# Patient Record
Sex: Male | Born: 1965 | Race: White | Hispanic: No | Marital: Single | Smoking: Current every day smoker
Health system: Southern US, Community
[De-identification: ages and names within clinical notes are randomized; demographics above are authoritative.]

## PROBLEM LIST (undated history)

## (undated) DIAGNOSIS — J449 Chronic obstructive pulmonary disease, unspecified: Secondary | ICD-10-CM

## (undated) DIAGNOSIS — F32A Depression, unspecified: Secondary | ICD-10-CM

## (undated) DIAGNOSIS — F419 Anxiety disorder, unspecified: Secondary | ICD-10-CM

## (undated) HISTORY — PX: OTHER SURGICAL HISTORY: SHX169

## (undated) HISTORY — DX: Depression, unspecified: F32.A

## (undated) HISTORY — DX: Anxiety disorder, unspecified: F41.9

---

## 2003-11-10 ENCOUNTER — Emergency Department (HOSPITAL_COMMUNITY): Admission: EM | Admit: 2003-11-10 | Discharge: 2003-11-10 | Payer: Self-pay | Admitting: *Deleted

## 2009-01-03 ENCOUNTER — Emergency Department (HOSPITAL_COMMUNITY): Admission: EM | Admit: 2009-01-03 | Discharge: 2009-01-04 | Payer: Self-pay | Admitting: Emergency Medicine

## 2009-01-05 ENCOUNTER — Emergency Department (HOSPITAL_COMMUNITY): Admission: EM | Admit: 2009-01-05 | Discharge: 2009-01-05 | Payer: Self-pay | Admitting: Emergency Medicine

## 2010-10-07 LAB — COMPREHENSIVE METABOLIC PANEL
ALT: 31 U/L (ref 0–53)
Albumin: 3.9 g/dL (ref 3.5–5.2)
Alkaline Phosphatase: 57 U/L (ref 39–117)
Calcium: 9 mg/dL (ref 8.4–10.5)
GFR calc Af Amer: >60 mL/min (ref >60)
Potassium: 3.5 mEq/L (ref 3.5–5.1)
Sodium: 143 mEq/L (ref 135–145)
Total Protein: 6.7 g/dL (ref 6.0–8.3)

## 2010-10-07 LAB — HEPATIC FUNCTION PANEL
ALT: 26 U/L (ref 0–53)
AST: 28 U/L (ref 0–37)
Albumin: 3.6 g/dL (ref 3.5–5.2)
Bilirubin, Direct: 0.1 mg/dL (ref 0.0–0.3)
Total Protein: 6.6 g/dL (ref 6.0–8.3)

## 2010-10-07 LAB — POCT CARDIAC MARKERS
CKMB, poc: <1 ng/mL — ABNORMAL LOW (ref 1.0–8.0)
Myoglobin, poc: 27.3 ng/mL (ref 12–200)
Myoglobin, poc: 38.1 ng/mL (ref 12–200)

## 2010-10-07 LAB — URINALYSIS, ROUTINE W REFLEX MICROSCOPIC
Glucose, UA: NEGATIVE mg/dL
Hgb urine dipstick: NEGATIVE
Ketones, ur: NEGATIVE mg/dL
Protein, ur: NEGATIVE mg/dL
pH: 5.5 (ref 5.0–8.0)

## 2010-10-07 LAB — DIFFERENTIAL
Basophils Absolute: 0 10*3/uL (ref 0.0–0.1)
Basophils Relative: 1 % (ref 0–1)
Basophils Relative: 2 % — ABNORMAL HIGH (ref 0–1)
Eosinophils Absolute: 0.1 10*3/uL (ref 0.0–0.7)
Lymphocytes Relative: 27 % (ref 12–46)
Lymphs Abs: 1.6 10*3/uL (ref 0.7–4.0)
Monocytes Absolute: 0.4 10*3/uL (ref 0.1–1.0)
Monocytes Absolute: 0.5 10*3/uL (ref 0.1–1.0)
Monocytes Relative: 8 % (ref 3–12)
Neutro Abs: 3.9 10*3/uL (ref 1.7–7.7)
Neutrophils Relative %: 63 % (ref 43–77)

## 2010-10-07 LAB — CBC
Hemoglobin: 15 g/dL (ref 13.0–17.0)
MCHC: 36.2 g/dL — ABNORMAL HIGH (ref 30.0–36.0)
Platelets: 200 10*3/uL (ref 150–400)
Platelets: 204 10*3/uL (ref 150–400)
RBC: 4.21 MIL/uL — ABNORMAL LOW (ref 4.22–5.81)
RDW: 12.4 % (ref 11.5–15.5)
RDW: 12.4 % (ref 11.5–15.5)
WBC: 5.6 10*3/uL (ref 4.0–10.5)

## 2010-10-07 LAB — URINE CULTURE
Colony Count: NO GROWTH
Culture: NO GROWTH

## 2010-10-07 LAB — BASIC METABOLIC PANEL
BUN: 5 mg/dL — ABNORMAL LOW (ref 6–23)
CO2: 23 mEq/L (ref 19–32)
Calcium: 9 mg/dL (ref 8.4–10.5)
Creatinine, Ser: 0.74 mg/dL (ref 0.4–1.5)
GFR calc non Af Amer: >60 mL/min (ref >60)
Glucose, Bld: 100 mg/dL — ABNORMAL HIGH (ref 70–99)
Sodium: 132 mEq/L — ABNORMAL LOW (ref 135–145)

## 2012-04-01 ENCOUNTER — Emergency Department (HOSPITAL_COMMUNITY)
Admission: EM | Admit: 2012-04-01 | Discharge: 2012-04-01 | Disposition: A | Discharge status: Home / Self Care | Payer: Worker's Compensation | Attending: Emergency Medicine | Admitting: Emergency Medicine

## 2012-04-01 ENCOUNTER — Encounter (HOSPITAL_COMMUNITY): Payer: Self-pay | Admitting: Emergency Medicine

## 2012-04-01 ENCOUNTER — Emergency Department (HOSPITAL_COMMUNITY): Payer: Worker's Compensation

## 2012-04-01 DIAGNOSIS — M79609 Pain in unspecified limb: Secondary | ICD-10-CM | POA: Insufficient documentation

## 2012-04-01 DIAGNOSIS — S8990XA Unspecified injury of unspecified lower leg, initial encounter: Secondary | ICD-10-CM | POA: Insufficient documentation

## 2012-04-01 DIAGNOSIS — M79676 Pain in unspecified toe(s): Secondary | ICD-10-CM

## 2012-04-01 MED ORDER — HYDROCODONE-ACETAMINOPHEN 5-325 MG PO TABS: 1.0000 | ORAL_TABLET | Freq: Four times a day (QID) | ORAL | Status: AC | PRN

## 2012-04-01 MED ORDER — HYDROCODONE-ACETAMINOPHEN 5-325 MG PO TABS
1.0000 | ORAL_TABLET | Freq: Once | ORAL | Status: AC
  Administered 2012-04-01: 1 via ORAL
  Filled 2012-04-01: qty 1

## 2012-04-01 MED ORDER — IBUPROFEN 400 MG PO TABS
600.0000 mg | ORAL_TABLET | Freq: Once | ORAL | Status: AC
  Administered 2012-04-01: 600 mg via ORAL
  Filled 2012-04-01: qty 2

## 2012-04-01 NOTE — ED Notes (Signed)
Patient states he fell and injured his left foot 2 months ago. Patient states he has had swelling and pain since then but it has worsened and he "can't hardly walk."

## 2012-04-01 NOTE — ED Provider Notes (Signed)
History     CSN: [id number]  Arrival date & time 04/01/12  2048   None     Chief Complaint  Patient presents with  . Foot Injury    (Consider location/radiation/quality/duration/timing/severity/associated sxs/prior treatment) HPI Comments: Pt was pulling on a pipe using it a lever.  He slipped and fell hyper dorsiflexing L foot. Has had  Pain and swelling in 1st MTP since.    Patient is a 46 y.o. male presenting with foot injury. The history is provided by the patient.  Foot Injury  Incident onset: ~ 2 months ago. The incident occurred at work. The pain is present in the left foot. The pain is moderate. The pain has been constant since onset. Associated symptoms include inability to bear weight and loss of motion. He reports no foreign bodies present. The symptoms are aggravated by bearing weight. He has tried nothing for the symptoms.    History reviewed. No pertinent past medical history.  Past Surgical History  Procedure Date  . Arm surgery     History reviewed. No pertinent family history.  History  Substance Use Topics  . Smoking status: Current Every Day Smoker  . Smokeless tobacco: Not on file  . Alcohol Use: Yes     daily      Review of Systems  Musculoskeletal:       Foot injury  Skin: Negative for wound.  All other systems reviewed and are negative.    Allergies  Review of patient's allergies indicates no known allergies.  Home Medications  No current outpatient prescriptions on file.  BP 115/73  Pulse 73  Temp 97.4 F (36.3 C) (Oral)  Resp 20  Ht 5\' 2"  (1.575 m)  Wt 125 lb (56.7 kg)  BMI 22.86 kg/m2  SpO2 100%  Physical Exam  Nursing note and vitals reviewed. Constitutional: He is oriented to person, place, and time. He appears well-developed and well-nourished.  HENT:  Head: Normocephalic and atraumatic.  Eyes: EOM are normal.  Neck: Normal range of motion.  Cardiovascular: Normal rate, regular rhythm, normal heart sounds and intact  distal pulses.   Pulmonary/Chest: Effort normal and breath sounds normal. No respiratory distress.  Abdominal: Soft. He exhibits no distension. There is no tenderness.  Musculoskeletal: He exhibits tenderness.       Left foot: He exhibits decreased range of motion, tenderness, bony tenderness and swelling. He exhibits normal capillary refill, no crepitus, no deformity and no laceration.       Feet:  Neurological: He is alert and oriented to person, place, and time.  Skin: Skin is warm and dry.  Psychiatric: He has a normal mood and affect. Judgment normal.    ED Course  Procedures (including critical care time)  Labs Reviewed - No data to display Dg Foot Complete Left  04/01/2012  *RADIOLOGY REPORT*  Clinical Data: Left foot pain and swelling.  LEFT FOOT - COMPLETE 3+ VIEW  Comparison: None.  Findings: Three-view exam of the left foot shows no evidence for acute fracture involves the great toe.  There is degenerative change at the MTP joint of the great toe.  Deformity of the fourth toe proximal phalanx is compatible with age indeterminate trauma.  IMPRESSION: No acute findings in the great toe although there is degenerative change in the great toe MTP joint.  Deformity of the fourth toe proximal phalanx likely secondary to old trauma.   Original Report Authenticated By: ERIC A. MANSELL, M.D.      1. Great toe pain  MDM   no fxs Ice, elevation, post op shoe. Ibuprofen rx-hydrocodone, 20 F/u with dr. Romeo Apple.        Evalina Field, [place] 04/01/12 2229

## 2012-04-02 NOTE — ED Provider Notes (Signed)
Medical screening examination/treatment/procedure(s) were performed by non-physician practitioner and as supervising physician I was immediately available for consultation/collaboration.  Shelda Jakes, MD 04/02/12 [phone]

## 2014-01-06 ENCOUNTER — Encounter (HOSPITAL_COMMUNITY): Payer: Self-pay | Admitting: Emergency Medicine

## 2014-01-06 ENCOUNTER — Emergency Department (HOSPITAL_COMMUNITY)
Admission: EM | Admit: 2014-01-06 | Discharge: 2014-01-06 | Disposition: A | Discharge status: Home / Self Care | Payer: Self-pay | Attending: Emergency Medicine | Admitting: Emergency Medicine

## 2014-01-06 DIAGNOSIS — L259 Unspecified contact dermatitis, unspecified cause: Secondary | ICD-10-CM

## 2014-01-06 DIAGNOSIS — Z760 Encounter for issue of repeat prescription: Secondary | ICD-10-CM | POA: Insufficient documentation

## 2014-01-06 DIAGNOSIS — J449 Chronic obstructive pulmonary disease, unspecified: Secondary | ICD-10-CM | POA: Insufficient documentation

## 2014-01-06 DIAGNOSIS — L255 Unspecified contact dermatitis due to plants, except food: Secondary | ICD-10-CM | POA: Insufficient documentation

## 2014-01-06 DIAGNOSIS — Z79899 Other long term (current) drug therapy: Secondary | ICD-10-CM | POA: Insufficient documentation

## 2014-01-06 DIAGNOSIS — J4489 Other specified chronic obstructive pulmonary disease: Secondary | ICD-10-CM | POA: Insufficient documentation

## 2014-01-06 DIAGNOSIS — IMO0002 Reserved for concepts with insufficient information to code with codable children: Secondary | ICD-10-CM | POA: Insufficient documentation

## 2014-01-06 DIAGNOSIS — F17200 Nicotine dependence, unspecified, uncomplicated: Secondary | ICD-10-CM | POA: Insufficient documentation

## 2014-01-06 HISTORY — DX: Chronic obstructive pulmonary disease, unspecified: J44.9

## 2014-01-06 MED ORDER — PREDNISONE 10 MG PO TABS: ORAL_TABLET | ORAL | Status: DC

## 2014-01-06 MED ORDER — FAMOTIDINE 20 MG PO TABS
40.0000 mg | ORAL_TABLET | Freq: Once | ORAL | Status: AC
  Administered 2014-01-06: 40 mg via ORAL
  Filled 2014-01-06 (×2): qty 2

## 2014-01-06 MED ORDER — FAMOTIDINE 20 MG PO TABS: 20.0000 mg | ORAL_TABLET | Freq: Two times a day (BID) | ORAL | Status: DC

## 2014-01-06 MED ORDER — DIPHENHYDRAMINE HCL 25 MG PO CAPS
25.0000 mg | ORAL_CAPSULE | Freq: Once | ORAL | Status: AC
Start: 2014-01-06 — End: 2014-01-06
  Administered 2014-01-06: 25 mg via ORAL
  Filled 2014-01-06: qty 1

## 2014-01-06 MED ORDER — ALBUTEROL SULFATE HFA 108 (90 BASE) MCG/ACT IN AERS: 1.0000 | INHALATION_SPRAY | Freq: Four times a day (QID) | RESPIRATORY_TRACT | Status: DC | PRN

## 2014-01-06 MED ORDER — PREDNISONE 50 MG PO TABS
60.0000 mg | ORAL_TABLET | Freq: Once | ORAL | Status: AC
  Administered 2014-01-06: 60 mg via ORAL
  Filled 2014-01-06 (×2): qty 1

## 2014-01-06 NOTE — ED Notes (Signed)
Pt c/o generalized rash that burns and itches. Pt reports started 2 weeks ago and "comes and goes". Has tried OTC creams and lotions that has not helped.

## 2014-01-06 NOTE — ED Provider Notes (Signed)
CSN: [id number]     Arrival date & time 01/06/14  0849 History   First MD Initiated Contact with Patient 01/06/14 [phone]     Chief Complaint  Patient presents with  . Rash     (Consider location/radiation/quality/duration/timing/severity/associated sxs/prior Treatment) Patient is a 48 y.o. male presenting with rash. The history is provided by the patient and a relative.  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   Past Medical History  Diagnosis Date  . COPD (chronic obstructive pulmonary disease)    Past Surgical History  Procedure Laterality Date  . Arm surgery     History reviewed. No pertinent family history. History  Substance Use Topics  . Smoking status: Current Every Day Smoker  . Smokeless tobacco: Not on file  . Alcohol Use: Yes     Comment: daily    Review of Systems  Constitutional: Negative for fever.  HENT: Negative for sore throat.   Respiratory: Negative for shortness of breath and wheezing.   Gastrointestinal: Negative for nausea and  abdominal pain.  Skin: Positive for rash.      Allergies  Review of patient's allergies indicates no known allergies.  Home Medications   Prior to Admission medications   Medication Sig Start Date End Date Taking? Authorizing Provider  albuterol (PROVENTIL HFA;VENTOLIN HFA) 108 (90 BASE) MCG/ACT inhaler Inhale 1-2 puffs into the lungs every 6 (six) hours as needed for wheezing or shortness of breath.   Yes Historical Provider, MD  albuterol (PROVENTIL HFA;VENTOLIN HFA) 108 (90 BASE) MCG/ACT inhaler Inhale 1-2 puffs into the lungs every 6 (six) hours as needed for wheezing or shortness of breath. 01/06/14   Burgess Amor, PA-C  famotidine (PEPCID) 20 MG tablet Take 1 tablet (20 mg total) by mouth 2 (two) times daily. 01/06/14   Burgess Amor, PA-C  predniSONE (DELTASONE) 10 MG tablet 6, 5, 4, 3, 2 then 1 tablet by mouth daily for 6 days total. 01/06/14   Burgess Amor, PA-C   BP 134/87  Pulse 97  Temp(Src) 98 F (36.7 C) (Oral)  Ht 5\' 7"  (1.702 m)  Wt 120 lb (54.432 kg)  BMI 18.79 kg/m2  SpO2 100% Physical Exam  Constitutional: He appears well-developed and well-nourished. No distress.  HENT:  Head: Normocephalic.  Neck: Neck supple.  Cardiovascular: Normal rate.   Pulmonary/Chest: Effort normal. He has no wheezes. He has no rhonchi.  Musculoskeletal: Normal range of motion. He exhibits no edema.  Skin: Rash noted.  Patchy, excoriated macular dark lesions bilateral ankles and dorsal feet.  Excoriations on dorsal hands without obvious rash.  No blistering or drainage.  Lesions are dry with no central clearing.  Larges lesion 1 cm greatest diameter.  Few lesions upper back.      ED Course  Procedures (including critical care time) Labs Review Labs Reviewed - No data to display  Imaging Review No results found.   EKG Interpretation None      MDM   Final diagnoses:  Contact dermatitis  Medication refill   Prednisone, pepcid, benadryl.  Cool compresses.  Resource guide given for  finding pcp.  Also was given a refill prescription for his albuterol.  He [facility],  Did not qualify to continue going there,  So has tried to use his albuterol mdi sparingly.  No respiratory distress today.  Will prescribe refill.  Prn f/u anticipated.    Burgess AmorJulie Milicent Acheampong, PA-C 01/06/14 [phone]

## 2014-01-06 NOTE — Discharge Instructions (Signed)
Contact Dermatitis °Contact dermatitis is a reaction to certain substances that touch the skin. Contact dermatitis can be either irritant contact dermatitis or allergic contact dermatitis. Irritant contact dermatitis does not require previous exposure to the substance for a reaction to occur. Allergic contact dermatitis only occurs if you have been exposed to the substance before. Upon a repeat exposure, your body reacts to the substance.  °CAUSES  °Many substances can cause contact dermatitis. Irritant dermatitis is most commonly caused by repeated exposure to mildly irritating substances, such as: °· Makeup. °· Soaps. °· Detergents. °· Bleaches. °· Acids. °· Metal salts, such as nickel. °Allergic contact dermatitis is most commonly caused by exposure to: °· Poisonous plants. °· Chemicals (deodorants, shampoos). °· Jewelry. °· Latex. °· Neomycin in triple antibiotic cream. °· Preservatives in products, including clothing. °SYMPTOMS  °The area of skin that is exposed may develop: °· Dryness or flaking. °· Redness. °· Cracks. °· Itching. °· Pain or a burning sensation. °· Blisters. °With allergic contact dermatitis, there may also be swelling in areas such as the eyelids, mouth, or genitals.  °DIAGNOSIS  °Your caregiver can usually tell what the problem is by doing a physical exam. In cases where the cause is uncertain and an allergic contact dermatitis is suspected, a patch skin test may be performed to help determine the cause of your dermatitis. °TREATMENT °Treatment includes protecting the skin from further contact with the irritating substance by avoiding that substance if possible. Barrier creams, powders, and gloves may be helpful. Your caregiver may also recommend: °· Steroid creams or ointments applied 2 times daily. For best results, soak the rash area in cool water for 20 minutes. Then apply the medicine. Cover the area with a plastic wrap. You can store the steroid cream in the refrigerator for a "chilly"  effect on your rash. That may decrease itching. Oral steroid medicines may be needed in more severe cases. °· Antibiotics or antibacterial ointments if a skin infection is present. °· Antihistamine lotion or an antihistamine taken by mouth to ease itching. °· Lubricants to keep moisture in your skin. °· Burow's solution to reduce redness and soreness or to dry a weeping rash. Mix one packet or tablet of solution in 2 cups cool water. Dip a clean washcloth in the mixture, wring it out a bit, and put it on the affected area. Leave the cloth in place for 30 minutes. Do this as often as possible throughout the day. °· Taking several cornstarch or baking soda baths daily if the area is too large to cover with a washcloth. °Harsh chemicals, such as alkalis or acids, can cause skin damage that is like a burn. You should flush your skin for 15 to 20 minutes with cold water after such an exposure. You should also seek immediate medical care after exposure. Bandages (dressings), antibiotics, and pain medicine may be needed for severely irritated skin.  °HOME CARE INSTRUCTIONS °· Avoid the substance that caused your reaction. °· Keep the area of skin that is affected away from hot water, soap, sunlight, chemicals, acidic substances, or anything else that would irritate your skin. °· Do not scratch the rash. Scratching may cause the rash to become infected. °· You may take cool baths to help stop the itching. °· Only take over-the-counter or prescription medicines as directed by your caregiver. °· See your caregiver for follow-up care as directed to make sure your skin is healing properly. °SEEK MEDICAL CARE IF:  °· Your condition is not better after 3   days of treatment.  You seem to be getting worse.  You see signs of infection such as swelling, tenderness, redness, soreness, or warmth in the affected area.  You have any problems related to your medicines. Document Released: 06/14/2000 Document Revised: 09/09/2011  Document Reviewed: 11/20/2010 [facility] Patient Information 2015 [place], [place]. This information is not intended to replace advice given to you by your health care provider. Make sure you discuss any questions you have with your health care provider.   Emergency Department Resource Guide 1) Find a Doctor and Pay Out of Pocket Although you won't have to find out who is covered by your insurance plan, it is a good idea to ask around and get recommendations. You will then need to call the office and see if the doctor you have chosen will accept you as a new patient and what types of options they offer for patients who are self-pay. Some doctors offer discounts or will set up payment plans for their patients who do not have insurance, but you will need to ask so you aren't surprised when you get to your appointment.  2) Contact Your Local Health Department Not all health departments have doctors that can see patients for sick visits, but many do, so it is worth a call to see if yours does. If you don't know where your local health department is, you can check in your phone book. The CDC also has a tool to help you locate your state's health department, and many state websites also have listings of all of their local health departments.  3) Find a [facility] If your illness is not likely to be very severe or complicated, you may want to try a walk in clinic. These are popping up all over the country in pharmacies, drugstores, and shopping centers. They're usually staffed by nurse practitioners or physician assistants that have been trained to treat common illnesses and complaints. They're usually fairly quick and inexpensive. However, if you have serious medical issues or chronic medical problems, these are probably not your best option.  No Primary Care Doctor: - Call Health Connect at  [phone] - they can help you locate a primary care doctor that  accepts your insurance, provides certain services,  etc. - Physician Referral Service- [phone]  Chronic Pain Problems: Organization         Address  Phone   Notes  [facility]  [phone] Patients need to be referred by their primary care doctor.   Medication Assistance: Organization         Address  Phone   Notes  [facility] [address] [place], [place] [address] [phone] --Must be a resident of [facility] -- Must have NO insurance coverage whatsoever (no Medicaid/ Medicare, etc.) -- The pt. MUST have a primary care doctor that directs their care regularly and follows them in the community   [facility]  [phone]   [employer]  [phone]    Agencies that provide inexpensive medical care: Organization         Address  Phone   Notes  [place]  [phone]   [facility]    [phone]   [facility] [place], [place] [address] [phone]   [place] [address] [place] [phone]   [facility]anned Parenthood    ([phone]) [phone]   [facility]    ([phone]) [phone]   °[facility] ° [address], [place] Phone:  ([phone]) [phone], Fax:  ([phone] Hours of Operation:  9 am - 6 pm, M-F.  Also accepts Medicaid/Medicare and self-pay.  °[facility] ° [address], [place] Phone: ([phone]) [phone], Fax: [phone]) [phone]. Hours of Operation:  8:30 am - 5:30 pm, M-F.  Also accepts Medicaid and self-pay.  °[facility] [place] [address], [place] Phone: ([phone]) [phone]   °[facility] [address], [place], [place] (336)[phone], Ext. 123 Mondays & Thursdays: 7-9 AM.  First 15 patients are seen on a first come, first serve basis. °  ° °Medicaid-accepting [place] Providers: ° °Organization         Address  Phone   Notes  °[facility] [address], [place] ([phone]) [phone] Also accepts self-pay patients.  °[facility] [address], [place] ° (336) [phone]   °[facility] [address], [place] (336) [phone]   °[facility] [address] [place] [address], [place] (336) [phone]   °Veita Bland [address], [place]  ° (336) [phone] Only accepts  Access Medicaid patients after they have their name applied to their card.  ° °Self-Pay (no insurance) in [place]: ° °Organization         Address  Phone   Notes  °Sickle Cell Patients, [facility] [address], [place] (336) [phone]   °[facility] [address], [place] (336) 832-4400   °[facility] ° [address], [place] (336) [phone]   °[facility]/Dr. Osei-Bonsu ° [address] [place] [address], [place] or [address], [place] ([phone]) [phone] Phone number for both [place] and [place] locations is the same.  °[facility] [address], [place] (336) [phone]   °[facility] [address] [place] [address], [place] or [address] (336) 852-7530 °(336) 878-2260   °[facility] [address], [place] (336) 350-1642, phone; (336) [phone], fax Sees patients 1st and 3rd Saturday of every month.  Must not qualify for public or private insurance (i.e. Medicaid, Medicare, [place] Health Choice, Veterans' Benefits) • Household income should be no more than 200% of the poverty level •The clinic cannot treat you if you are pregnant or think you are pregnant • Sexually transmitted diseases are not treated at the clinic.  ° ° °Dental Care: °Organization         Address  Phone  Notes  °[place] [facility] [address],  (336) [phone] Accepts children up to  age 21 who are enrolled in Medicaid or [place] Health Choice; pregnant women with a Medicaid card; and children who have applied for Medicaid or [place] Health Choice, but were declined, whose parents can pay a reduced fee at time of service.  °[place] [facility] [place]  [address], [place] (336) 641-7733 Accepts children up to age 21 who are enrolled in Medicaid or [place] Health Choice; pregnant women with a Medicaid card; and children who have applied for Medicaid or [place] Health Choice, but were declined, whose parents can pay a reduced fee at time of service.  °  [facility] ° [address], [place] (336) [phone] Patients are seen by appointment only. Walk-ins are not accepted. [facility] will see patients 18 years of age and older. °Monday - Tuesday (8am-5pm) °Most Wednesdays (8:30-5pm) °$30 per visit, cash only  °[facility] ° [address], [place] (336) [phone] Patients are seen by appointment only. Walk-ins are not accepted. [facility] will see patients 18 years of age and older. °One Wednesday Evening (Monthly: Volunteer Based).  $30 per visit, cash only  °[facility]  ([phone]) [phone] for adults; Children under age 4, call Graduate Pediatric Dentistry at ([phone]. Children aged 4-14, please call ([phone]) [phone] to request a pediatric application. ° Dental services are provided in all areas of dental care including fillings, crowns and bridges, complete and partial dentures, implants, gum treatment, root canals, and extractions. Preventive care is also provided. Treatment is provided to both adults and children. °Patients are selected via a lottery and there is often a waiting list. °  °[facility] [address], °[place] ° (336) 763-8833 [email or web address] °  °[facility] [address], [place], [place] (336)723-1848, Ext. 123 Second and Fourth Thursday of  each month, opens at 6:30 AM; Clinic ends at 9 AM.  Patients are seen on a first-come first-served basis, and a limited number are seen during each clinic.  ° °[facility] ° [address], [place], [place] (336) [phone]   Eligibility Requirements °You must have lived in [place], [place], or [place] counties for at least the last three months. °  You cannot be eligible for state or federal sponsored healthcare insurance, including Veterans Administration, Medicaid, or Medicare. °  You generally cannot be eligible for healthcare insurance through your employer.  °  How to apply: °Eligibility screenings are held every Tuesday and Wednesday afternoon from 1:00 pm until 4:00 pm. You do not need an appointment for the interview!  °[facility] [address], [place], [place] [phone]   °[facility]  336-342-8273   °[facility]  336-703-3100   °[facility]  336-570-6415   ° °Behavioral Health Resources in the Community: °Intensive Outpatient Programs °Organization         Address  Phone  Notes  °[facility] [address], [place], [place] 336-878-6098   °[facility] [address], [place], [place] [phone]   °[facility] [address], [place], [place] ° 336-882-2125   °[facility] [address],  °[place], [place] 1-800-853-5163 or 336-641-4981   °Substance Abuse Resources °Organization         Address  Phone  Notes  °[facility]  336-882-2125   °[facility]  336-784-9470   °[facility]  336-285-9073   °[facility]  336-845-3988   °Residential & Outpatient Substance Abuse Program  1-800-659-3381   °Psychological Services °Organization         Address  Phone  Notes  °[facility]  336- 832-9600   °[facility]  336- 378-7881   °[facility] [address],  [place] 1-800-853-5163 or 336-641-4981   ° °Mobile Crisis Teams °Organization         Address  Phone  Notes  °[facility], Mobile Crisis Care Unit  1-877-626-1772   °Assertive °Psychotherapeutic Services ° [address]   [address] [place], [place] [phone]   [facility] Incharon DeEsch [address] [place] [phone]    Self-Help/Support Groups Organization         Address  Phone             Notes  [facility] - variety of support groups  336- [phone] [phone] Call for more information  Narcotics Anonymous (NA), [facility] [address], [place]  2 meetings at this location   [facility] Organization         Address  Phone  Notes  [facility] [address],    [place]  [phone]   [facility]  [address], [place] [phone], [place], [place] [phone]   [facility] [address], [place] [phone] Admissions: 8am-3pm M-F  [facility] [address]    [place], [place] [phone]   [facility] [address], [place], [place] [phone]   [facility] [address]  [place], [place] [phone]   [facility] Intensive Outpatient [address], [place], [place] [phone]   [facility] ([facility] [address]  [place], [place] [phone] or [phone]   [facility] ([facility]) [address] [place], [place] [phone] Accepts Medicaid  [facility] [address]  [place] [phone]) [phone] Substance Abuse/Addiction Treatment   [facility] Resources Organization         Address  Phone  Notes  [facility]  [phone](888) [phone]   Angie FavaJulie Brannon, PhD [address], [address] [place], [place]   ([phone](336) [phone] or [phone](336) [phone]   [facility]   [address] [place], [place] [phone](336) [phone]     [facility] [address], [place], [place] [phone](336) [phone] Insurance/Medicaid/sponsorship through [facility]  [facility] [address]                                    [place], [place] [phone]) [phone] Therapy/tele-psych/case  [facility] [address].   [place], [place] [phone](336) [phone]    Dr. Lolly MustacheArfeen  [phone](336) [phone]   [facility] [place] [place]  [employer] [facility] 1) [address], [place] 2) [address], [place] 3)  [address], [place] [phone]) [phone](336) [phone](336) [phone]  [phone](336) [phone]   [facility] Child Abuse Hot[facility] [phone](336) (450)[phone] or (949)[phone](336) [phone] (After Hours)         Take your next dose of prednisone tomorrow morning.  Take one more dose of pepcid before bed tonight.  You may also use benadryl as discussed for itching - either the tablets or the spray topical if you can find it.  Cool compresses or ice packs can help with itching.

## 2014-01-07 NOTE — ED Provider Notes (Signed)
Medical screening examination/treatment/procedure(s) were performed by non-physician practitioner and as supervising physician I was immediately available for consultation/collaboration.   EKG Interpretation None       Donnetta HutchingBrian Correy Weidner, MD 01/07/14 [phone]

## 2015-11-08 ENCOUNTER — Encounter (HOSPITAL_COMMUNITY): Payer: Self-pay

## 2015-11-08 ENCOUNTER — Emergency Department (HOSPITAL_COMMUNITY)
Admission: EM | Admit: 2015-11-08 | Discharge: 2015-11-08 | Disposition: A | Discharge status: Home / Self Care | Payer: Self-pay | Attending: Emergency Medicine | Admitting: Emergency Medicine

## 2015-11-08 ENCOUNTER — Emergency Department (HOSPITAL_COMMUNITY): Payer: Self-pay

## 2015-11-08 DIAGNOSIS — J449 Chronic obstructive pulmonary disease, unspecified: Secondary | ICD-10-CM | POA: Insufficient documentation

## 2015-11-08 DIAGNOSIS — F17200 Nicotine dependence, unspecified, uncomplicated: Secondary | ICD-10-CM | POA: Insufficient documentation

## 2015-11-08 DIAGNOSIS — Z79899 Other long term (current) drug therapy: Secondary | ICD-10-CM | POA: Insufficient documentation

## 2015-11-08 DIAGNOSIS — F1010 Alcohol abuse, uncomplicated: Secondary | ICD-10-CM | POA: Insufficient documentation

## 2015-11-08 DIAGNOSIS — K029 Dental caries, unspecified: Secondary | ICD-10-CM | POA: Insufficient documentation

## 2015-11-08 DIAGNOSIS — R55 Syncope and collapse: Secondary | ICD-10-CM | POA: Insufficient documentation

## 2015-11-08 DIAGNOSIS — E860 Dehydration: Secondary | ICD-10-CM | POA: Insufficient documentation

## 2015-11-08 LAB — RAPID URINE DRUG SCREEN, HOSP PERFORMED
AMPHETAMINES: NOT DETECTED
BENZODIAZEPINES: NOT DETECTED
Barbiturates: NOT DETECTED
Cocaine: NOT DETECTED
Opiates: NOT DETECTED
TETRAHYDROCANNABINOL: POSITIVE — AB

## 2015-11-08 LAB — PROTIME-INR
INR: 0.99 (ref 0.00–1.49)
Prothrombin Time: 13.3 seconds (ref 11.6–15.2)

## 2015-11-08 LAB — URINALYSIS, ROUTINE W REFLEX MICROSCOPIC
BILIRUBIN URINE: NEGATIVE
Glucose, UA: NEGATIVE mg/dL
HGB URINE DIPSTICK: NEGATIVE
Ketones, ur: NEGATIVE mg/dL
Leukocytes, UA: NEGATIVE
Nitrite: NEGATIVE
PH: 5 (ref 5.0–8.0)
Protein, ur: NEGATIVE mg/dL

## 2015-11-08 LAB — COMPREHENSIVE METABOLIC PANEL
ALK PHOS: 59 U/L (ref 38–126)
ALT: 51 U/L (ref 17–63)
AST: 35 U/L (ref 15–41)
Albumin: 4 g/dL (ref 3.5–5.0)
Anion gap: 10 (ref 5–15)
BILIRUBIN TOTAL: 1 mg/dL (ref 0.3–1.2)
BUN: 7 mg/dL (ref 6–20)
CALCIUM: 8.3 mg/dL — AB (ref 8.9–10.3)
CO2: 22 mmol/L (ref 22–32)
CREATININE: 0.74 mg/dL (ref 0.61–1.24)
Chloride: 104 mmol/L (ref 101–111)
Glucose, Bld: 72 mg/dL (ref 65–99)
Potassium: 3.6 mmol/L (ref 3.5–5.1)
Sodium: 136 mmol/L (ref 135–145)
TOTAL PROTEIN: 7.2 g/dL (ref 6.5–8.1)

## 2015-11-08 LAB — CBC
HEMATOCRIT: 45 % (ref 39.0–52.0)
Hemoglobin: 15.8 g/dL (ref 13.0–17.0)
MCH: 35 pg — AB (ref 26.0–34.0)
MCHC: 35.1 g/dL (ref 30.0–36.0)
MCV: 99.8 fL (ref 78.0–100.0)
PLATELETS: 259 10*3/uL (ref 150–400)
RBC: 4.51 MIL/uL (ref 4.22–5.81)
RDW: 12.7 % (ref 11.5–15.5)
WBC: 7.3 10*3/uL (ref 4.0–10.5)

## 2015-11-08 LAB — TROPONIN I: Troponin I: <0.03 ng/mL (ref <0.031)

## 2015-11-08 LAB — ETHANOL: ALCOHOL ETHYL (B): 209 mg/dL — AB (ref <5)

## 2015-11-08 LAB — MAGNESIUM: Magnesium: 2 mg/dL (ref 1.7–2.4)

## 2015-11-08 MED ORDER — SODIUM CHLORIDE 0.9 % IV SOLN: INTRAVENOUS | Status: DC

## 2015-11-08 MED ORDER — SODIUM CHLORIDE 0.9 % IV BOLUS (SEPSIS)
1000.0000 mL | Freq: Once | INTRAVENOUS | Status: AC
  Administered 2015-11-08: 1000 mL via INTRAVENOUS

## 2015-11-08 NOTE — Discharge Instructions (Signed)

## 2015-11-08 NOTE — ED Provider Notes (Signed)
CSN: [id number]     Arrival date & time 11/08/15  2034 History   First MD Initiated Contact with Patient 11/08/15 2042     Chief Complaint  Patient presents with  . Loss of Consciousness   PT SAID THAT HE WAS OUTSIDE PLANTING FLOWERS WHEN HE PASSED OUT.  PT ADMITS TO DRINKING ETOH.  PT SAID THAT HE HAS NOT BEEN EATING MUCH LATELY B/C HIS TEETH HAVE BEEN HURTING.  HE SAW HIS DENTIST  ABOUT 1 WEEK AGO AND WAS PUT ON ABX.  (Consider location/radiation/quality/duration/timing/severity/associated sxs/prior Treatment) Patient is a 50 y.o. male presenting with syncope. The history is provided by the patient.  Loss of Consciousness Episode history:  Single Most recent episode:  Today Progression:  Resolved   Past Medical History  Diagnosis Date  . COPD (chronic obstructive pulmonary disease) [facility](HCC)    Past Surgical History  Procedure Laterality Date  . Arm surgery     No family history on file. Social History  Substance Use Topics  . Smoking status: Current Every Day Smoker  . Smokeless tobacco: None  . Alcohol Use: Yes     Comment: daily    Review of Systems  Cardiovascular: Positive for syncope.  Neurological: Positive for syncope.  All other systems reviewed and are negative.     Allergies  Review of patient's allergies indicates no known allergies.  Home Medications   Prior to Admission medications   Medication Sig Start Date End Date Taking? Authorizing Provider  albuterol (PROVENTIL HFA;VENTOLIN HFA) 108 (90 BASE) MCG/ACT inhaler Inhale 1-2 puffs into the lungs every 6 (six) hours as needed for wheezing or shortness of breath.   Yes Historical Provider, MD  penicillin v potassium (VEETID) 500 MG tablet Take 1 tablet by mouth every 6 (six) hours. Until gone. 4 day course starting on 10/27/15 10/27/15  Yes Historical Provider, MD   BP 122/84 mmHg  Pulse 81  Temp(Src) 98.3 F (36.8 C) (Oral)  Resp 22  Ht 5\' 7"  (1.702 m)  Wt 120 lb (54.432 kg)  BMI 18.79 kg/m2  SpO2  99% Physical Exam  Constitutional: He is oriented to person, place, and time. He appears well-developed and well-nourished.  HENT:  Head: Normocephalic and atraumatic.  Right Ear: External ear normal.  Left Ear: External ear normal.  Mouth/Throat: Dental caries present.  Eyes: Conjunctivae and EOM are normal. Pupils are equal, round, and reactive to light.  Neck: Normal range of motion. Neck supple.  Cardiovascular: Normal rate, regular rhythm, normal heart sounds and intact distal pulses.   Pulmonary/Chest: Effort normal and breath sounds normal.  Abdominal: Soft. Bowel sounds are normal.  Musculoskeletal: Normal range of motion.  Neurological: He is alert and oriented to person, place, and time.  Skin: Skin is warm and dry.  Psychiatric: He has a normal mood and affect. His behavior is normal. Judgment and thought content normal.  Nursing note and vitals reviewed.   ED Course  Procedures (including critical care time) Labs Review Labs Reviewed  CBC - Abnormal; Notable for the following:    MCH 35.0 (*)    All other components within normal limits  COMPREHENSIVE METABOLIC PANEL - Abnormal; Notable for the following:    Calcium 8.3 (*)    All other components within normal limits  URINALYSIS, ROUTINE W REFLEX MICROSCOPIC (NOT AT [facility]) - Abnormal; Notable for the following:    Specific Gravity, Urine <1.005 (*)    All other components within normal limits  ETHANOL - Abnormal; Notable for  the following:    Alcohol, Ethyl (B) 209 (*)    All other components within normal limits  URINE RAPID DRUG SCREEN, HOSP PERFORMED - Abnormal; Notable for the following:    Tetrahydrocannabinol POSITIVE (*)    All other components within normal limits  PROTIME-INR  MAGNESIUM  TROPONIN I    Imaging Review Dg Chest 2 View  11/08/2015  CLINICAL DATA:  50 year old male with syncope. EXAM: CHEST  2 VIEW COMPARISON:  Chest radiograph dated 01/05/2009 FINDINGS: Two views of the chest demonstrate  mild emphysematous changes of the lungs. There is no focal consolidation, pleural effusion, or pneumothorax. The cardiac silhouette is within normal limits. No acute osseous pathology per IMPRESSION: No active cardiopulmonary disease. Electronically Signed   By: Elgie Collard M.D.   On: 11/08/2015 21:56   Ct Head Wo Contrast  11/08/2015  CLINICAL DATA:  50 year old male with syncope EXAM: CT HEAD WITHOUT CONTRAST TECHNIQUE: Contiguous axial images were obtained from the base of the skull through the vertex without intravenous contrast. COMPARISON:  Head CT dated 01/05/2009 FINDINGS: Evaluation of this exam is limited due to motion artifact. The ventricles and sulci are appropriate in size for the patient's age. Minimal periventricular and deep white matter chronic microvascular ischemic changes noted. There is no acute intracranial hemorrhage. No mass effect or midline shift. The visualized paranasal sinuses and mastoid air cells are clear. The calvarium is intact. IMPRESSION: No acute intracranial pathology. Electronically Signed   By: Elgie Collard M.D.   On: 11/08/2015 22:02   I have personally reviewed and evaluated these images and lab results as part of my medical decision-making.   EKG Interpretation   Date/Time:  Wednesday Nov 08 2015 20:52:39 EDT Ventricular Rate:  77 PR Interval:  149 QRS Duration: 88 QT Interval:  386 QTC Calculation: 437 R Axis:   79 Text Interpretation:  Sinus rhythm Baseline wander in lead(s) V3 Confirmed  by Wannetta Langland MD, Elmer Boutelle ([id number]) on 11/08/2015 9:24:30 PM      MDM  DRINK WATER NOT ALCOHOL.  RETURN IF WORSE.  F/U WITH PCP. Final diagnoses:  Vasovagal syncope  Alcohol abuse  Dehydration        Jacalyn Lefevre, MD 11/09/15 0004

## 2015-11-08 NOTE — ED Notes (Signed)
Patient given discharge instruction, verbalized understand. IV removed, band aid applied. Patient ambulatory out of the department.  

## 2015-11-08 NOTE — ED Notes (Signed)
Pt from home via ems after syncopal episode at home.  Pt states he fell forward and hit his face on the ground.  Pt admits to drinking etoh tonight.

## 2016-05-27 ENCOUNTER — Emergency Department (HOSPITAL_BASED_OUTPATIENT_CLINIC_OR_DEPARTMENT_OTHER)
Admission: EM | Admit: 2016-05-27 | Discharge: 2016-05-28 | Disposition: A | Discharge status: Home / Self Care | Payer: Medicaid Other | Attending: Emergency Medicine | Admitting: Emergency Medicine

## 2016-05-27 ENCOUNTER — Encounter (HOSPITAL_BASED_OUTPATIENT_CLINIC_OR_DEPARTMENT_OTHER): Payer: Self-pay | Admitting: *Deleted

## 2016-05-27 DIAGNOSIS — F1010 Alcohol abuse, uncomplicated: Secondary | ICD-10-CM

## 2016-05-27 DIAGNOSIS — R109 Unspecified abdominal pain: Secondary | ICD-10-CM

## 2016-05-27 DIAGNOSIS — F17200 Nicotine dependence, unspecified, uncomplicated: Secondary | ICD-10-CM | POA: Diagnosis not present

## 2016-05-27 DIAGNOSIS — R1084 Generalized abdominal pain: Secondary | ICD-10-CM | POA: Diagnosis not present

## 2016-05-27 DIAGNOSIS — J449 Chronic obstructive pulmonary disease, unspecified: Secondary | ICD-10-CM | POA: Diagnosis not present

## 2016-05-27 LAB — URINALYSIS, ROUTINE W REFLEX MICROSCOPIC
BILIRUBIN URINE: NEGATIVE
Glucose, UA: NEGATIVE mg/dL
HGB URINE DIPSTICK: NEGATIVE
KETONES UR: NEGATIVE mg/dL
Leukocytes, UA: NEGATIVE
NITRITE: NEGATIVE
PH: 6 (ref 5.0–8.0)
Protein, ur: NEGATIVE mg/dL
SPECIFIC GRAVITY, URINE: 1.006 (ref 1.005–1.030)

## 2016-05-27 NOTE — ED Triage Notes (Signed)
Pt c/o diffuse abd pain x 5 days , denies n/v/d

## 2016-05-28 ENCOUNTER — Emergency Department (HOSPITAL_BASED_OUTPATIENT_CLINIC_OR_DEPARTMENT_OTHER): Payer: Medicaid Other

## 2016-05-28 LAB — COMPREHENSIVE METABOLIC PANEL
ALK PHOS: 66 U/L (ref 38–126)
ALT: 53 U/L (ref 17–63)
AST: 56 U/L — ABNORMAL HIGH (ref 15–41)
Albumin: 3.9 g/dL (ref 3.5–5.0)
Anion gap: 10 (ref 5–15)
BILIRUBIN TOTAL: 0.4 mg/dL (ref 0.3–1.2)
BUN: 5 mg/dL — ABNORMAL LOW (ref 6–20)
CALCIUM: 8.9 mg/dL (ref 8.9–10.3)
CO2: 26 mmol/L (ref 22–32)
Chloride: 103 mmol/L (ref 101–111)
Creatinine, Ser: 0.74 mg/dL (ref 0.61–1.24)
Glucose, Bld: 92 mg/dL (ref 65–99)
Potassium: 3.6 mmol/L (ref 3.5–5.1)
SODIUM: 139 mmol/L (ref 135–145)
TOTAL PROTEIN: 7.1 g/dL (ref 6.5–8.1)

## 2016-05-28 LAB — CBC WITH DIFFERENTIAL/PLATELET
Basophils Absolute: 0.1 10*3/uL (ref 0.0–0.1)
Basophils Relative: 1 %
Eosinophils Absolute: 0.3 10*3/uL (ref 0.0–0.7)
Eosinophils Relative: 5 %
HEMATOCRIT: 43.8 % (ref 39.0–52.0)
HEMOGLOBIN: 15.5 g/dL (ref 13.0–17.0)
LYMPHS ABS: 1.7 10*3/uL (ref 0.7–4.0)
LYMPHS PCT: 28 %
MCH: 35.7 pg — AB (ref 26.0–34.0)
MCHC: 35.4 g/dL (ref 30.0–36.0)
MCV: 100.9 fL — AB (ref 78.0–100.0)
MONOS PCT: 11 %
Monocytes Absolute: 0.7 10*3/uL (ref 0.1–1.0)
NEUTROS PCT: 55 %
Neutro Abs: 3.4 10*3/uL (ref 1.7–7.7)
Platelets: 266 10*3/uL (ref 150–400)
RBC: 4.34 MIL/uL (ref 4.22–5.81)
RDW: 12.3 % (ref 11.5–15.5)
WBC: 6.1 10*3/uL (ref 4.0–10.5)

## 2016-05-28 LAB — LIPASE, BLOOD: Lipase: 22 U/L (ref 11–51)

## 2016-05-28 MED ORDER — ONDANSETRON HCL 4 MG/2ML IJ SOLN
4.0000 mg | Freq: Once | INTRAMUSCULAR | Status: AC
  Administered 2016-05-28: 4 mg via INTRAVENOUS
  Filled 2016-05-28: qty 2

## 2016-05-28 MED ORDER — ONDANSETRON 4 MG PO TBDP: 4.0000 mg | ORAL_TABLET | Freq: Three times a day (TID) | ORAL | 0 refills | Status: AC | PRN

## 2016-05-28 MED ORDER — MORPHINE SULFATE (PF) 4 MG/ML IV SOLN
4.0000 mg | Freq: Once | INTRAVENOUS | Status: AC
  Administered 2016-05-28: 4 mg via INTRAVENOUS
  Filled 2016-05-28: qty 1

## 2016-05-28 MED ORDER — SODIUM CHLORIDE 0.9 % IV BOLUS (SEPSIS)
1000.0000 mL | Freq: Once | INTRAVENOUS | Status: AC
  Administered 2016-05-28: 1000 mL via INTRAVENOUS

## 2016-05-28 MED ORDER — IOPAMIDOL (ISOVUE-300) INJECTION 61%
100.0000 mL | Freq: Once | INTRAVENOUS | Status: AC | PRN
  Administered 2016-05-28: 100 mL via INTRAVENOUS

## 2016-05-28 MED ORDER — VITAMIN B-1 100 MG PO TABS: 100.0000 mg | ORAL_TABLET | Freq: Every day | ORAL | 1 refills | Status: AC

## 2016-05-28 MED ORDER — OXYCODONE HCL 5 MG PO TABS: 5.0000 mg | ORAL_TABLET | Freq: Four times a day (QID) | ORAL | 0 refills | Status: AC | PRN

## 2016-05-28 NOTE — ED Provider Notes (Addendum)
By signing my name below, I, Teofilo PodMatthew P. Jamison, attest that this documentation has been prepared under the direction and in the presence of Duff Pozzi N Marka Treloar, DO . Electronically Signed: Teofilo PodMatthew P. Jamison, ED Scribe. 05/28/2016. 12:23 AM.  TIME SEEN: 12:18 AM   CHIEF COMPLAINT:  Chief Complaint  Patient presents with  . Abdominal Pain     HPI: HPI Comments:  Nicholas Smith is a 50 y.o. male who presents to the Emergency Department complaining of worsening diffuse abdominal pain x 2 weeks. Pt states that the pain is the most severe on the right side. Has worsened and become more constant over the past 5 days. Pt complains of associated cough that is chronic and unchanged, SOB, chills, and 1 episode of Hematochezia which has resolved. No melena. No alleviating factors noted. Pt denies fever, nausea, vomiting, diarrhea, penile discharge, testicular pain or swelling. No history of her previous abdominal surgery. No sick contacts or recent travel. Pain worse with coughing. No other aggravating or relieving factors. No radiation of pain.  ROS: See HPI Constitutional: no fever  Eyes: no drainage  ENT: no runny nose   Cardiovascular:  no chest pain  Resp: Positive for cough, no SOB  GI: no vomiting GU: No dysuria or hematuria.  Integumentary: no rash  Allergy: no hives  Musculoskeletal: no leg swelling  Neurological: no slurred speech ROS otherwise negative  PAST MEDICAL HISTORY/PAST SURGICAL HISTORY:  Past Medical History:  Diagnosis Date  . COPD (chronic obstructive pulmonary disease) (HCC)     MEDICATIONS:  Prior to Admission medications   Medication Sig Start Date End Date Taking? Authorizing Provider  albuterol (PROVENTIL HFA;VENTOLIN HFA) 108 (90 BASE) MCG/ACT inhaler Inhale 1-2 puffs into the lungs every 6 (six) hours as needed for wheezing or shortness of breath.    Historical Provider, MD    ALLERGIES:  No Known Allergies  SOCIAL HISTORY:  Social History  Substance Use  Topics  . Smoking status: Current Every Day Smoker    Packs/day: 2.00  . Smokeless tobacco: Never Used  . Alcohol use Yes     Comment: daily    FAMILY HISTORY: History reviewed. No pertinent family history.  EXAM: BP 129/84   Pulse 90   Temp 97.9 F (36.6 C)   Resp 16   Ht 5\' 6"  (1.676 m)   Wt 120 lb (54.4 kg)   SpO2 100%   BMI 19.37 kg/m  CONSTITUTIONAL: Alert and oriented and responds appropriately to questions. Chronically ill appearing; well-nourished HEAD: Normocephalic EYES: Conjunctivae clear, PERRL, EOMI ENT: normal nose; no rhinorrhea; moist mucous membranes NECK: Supple, no meningismus, no nuchal rigidity, no LAD  CARD: RRR; S1 and S2 appreciated; no murmurs, no clicks, no rubs, no gallops RESP: Normal chest excursion without splinting or tachypnea; breath sounds clear and equal bilaterally; no wheezes, no rhonchi, no rales, no hypoxia or respiratory distress, speaking full sentences ABD/GI: Normal bowel sounds; non-distended; soft, TTP diffusely with voluntary guarding, worse in the right mid abdomen, no rebound, no peritoneal signs, no hepatosplenomegaly, no hernias appreciated BACK:  The back appears normal and is non-tender to palpation, there is no CVA tenderness EXT: Normal ROM in all joints; non-tender to palpation; no edema; normal capillary refill; no cyanosis, no calf tenderness or swelling    SKIN: Normal color for age and race; warm; no rash NEURO: Moves all extremities equally, sensation to light touch intact diffusely, cranial nerves II through XII intact, normal speech PSYCH: The patient's mood and manner  are appropriate. Grooming and personal hygiene are appropriate.  MEDICAL DECISION MAKING: Patient here with diffuse abdominal tenderness on exam, worse in the right mid abdomen. Symptoms have been intermittent for 2 weeks but now more constant and severe past 5 days. This may be musculoskeletal in nature from his chronic cough but differential also  includes cholecystitis, cholelithiasis, colitis, appendicitis, kidney stone, UTI. Will obtain labs, urine and a CT of his abdomen pelvis. We'll treat pain with morphine. Also give IV fluids, Zofran.  ED PROGRESS: Patient's labs are unremarkable other than mild elevation of his AST. Urine shows no blood or sign of infection. CT scan shows no acute abnormality. Gallbladder appears normal. Normal-appearing liver. Pancreas is normal. Normal-appearing appendix. Bowel otherwise unremarkable. Discussed with him that this could be musculoskeletal pain from his chronic cough. He does report he drinks alcohol on a regular basis for the past 40 years. States he drinks approximately a 12 pack of beer a day. No history of withdrawal seizure. I recommended that he follow-up with PCP and try to abstain from alcohol. Discussed with him that I will discharge him with a short prescription for pain medication to take at home but not to drink alcohol when taking this. States that he does not drink every day and has not had any alcohol today. He does not appear intoxicated. Will get outpatient rehabilitation services as well as outpatient PCP follow-up information. Discussed return precautions. He is comfortable with this plan.  At this time, I do not feel there is any life-threatening condition present. I have reviewed and discussed all results (EKG, imaging, lab, urine as appropriate) and exam findings with patient/family. I have reviewed nursing notes and appropriate previous records.  I feel the patient is safe to be discharged home without further emergent workup and can continue workup as an outpatient as needed. Discussed usual and customary return precautions. Patient/family verbalize understanding and are comfortable with this plan.  Outpatient follow-up has been provided. All questions have been answered.      I personally performed the services described in this documentation, which was scribed in my presence. The  recorded information has been reviewed and is accurate.      Layla MawKristen N Daelen Belvedere, DO 05/28/16 [id number]    Layla MawKristen N Zeppelin Beckstrand, DO 05/28/16 [id number]

## 2016-05-28 NOTE — Discharge Instructions (Signed)
You were seen in the emergency department for abdominal pain. Your workup here today was negative. This may be muscle pain from coughing. I recommend that you try to stop drinking as this can cause damage to your liver. Please establish care with a primary care physician. We have provided you with outpatient resources if you're interested in rehabilitation.   To find a primary care or specialty doctor please call [phone] or [phone] to access "[facility] Find a Doctor Service."  You may also go on the [facility] website at [email or web address]  There are also multiple [facility], [facility], [facility] and [facility] practices throughout the [place] that are frequently accepting new patients. You may find a clinic that is close to your home and contact them.  [facility] -  [address] [place] [id number] [phone]   [facility] -  [address] [place] [id number] [phone]   [facility] [phone]  [place] [phone]

## 2016-08-12 ENCOUNTER — Encounter (HOSPITAL_BASED_OUTPATIENT_CLINIC_OR_DEPARTMENT_OTHER): Payer: Self-pay | Admitting: Emergency Medicine

## 2016-08-12 ENCOUNTER — Emergency Department (HOSPITAL_BASED_OUTPATIENT_CLINIC_OR_DEPARTMENT_OTHER)
Admission: EM | Admit: 2016-08-12 | Discharge: 2016-08-12 | Disposition: A | Discharge status: Home / Self Care | Payer: Medicaid Other | Attending: Emergency Medicine | Admitting: Emergency Medicine

## 2016-08-12 DIAGNOSIS — J449 Chronic obstructive pulmonary disease, unspecified: Secondary | ICD-10-CM | POA: Diagnosis not present

## 2016-08-12 DIAGNOSIS — B353 Tinea pedis: Secondary | ICD-10-CM | POA: Diagnosis not present

## 2016-08-12 DIAGNOSIS — F17200 Nicotine dependence, unspecified, uncomplicated: Secondary | ICD-10-CM | POA: Insufficient documentation

## 2016-08-12 DIAGNOSIS — M79671 Pain in right foot: Secondary | ICD-10-CM | POA: Diagnosis present

## 2016-08-12 NOTE — ED Notes (Signed)
Pt refused a wheelchair in triage

## 2016-08-12 NOTE — Discharge Instructions (Signed)
Try one of the over the counter creams for this.  Follow up with a foot doctor as needed.  Follow up with your family doctor for if this does not improve may be related to nerve damage or a vitamin deficiency.

## 2016-08-12 NOTE — ED Notes (Signed)
ED Provider at bedside. 

## 2016-08-12 NOTE — ED Provider Notes (Signed)
[facility] Provider Note   CSN: [id number] Arrival date & time: 08/12/16  0920     History   Chief Complaint Chief Complaint  Patient presents with  . Foot Pain    HPI Nicholas Smith is a 51 y.o. male.  51 yo M with a chief complaint of bilateral foot pain. Is on the plantar surface. Going on for the past couple weeks. Patient states that he has been out of the cold quite a bit recently. Had some color change to the area as well. Denies fevers or chills. Describes it as a sharp and shooting pain. Denies radiation. Denies injury.   The history is provided by the patient.  Foot Pain  This is a new problem. The current episode started more than 1 week ago. The problem occurs constantly. The problem has not changed since onset.Pertinent negatives include no chest pain, no abdominal pain, no headaches and no shortness of breath. Nothing aggravates the symptoms. Nothing relieves the symptoms. He has tried nothing for the symptoms. The treatment provided no relief.    Past Medical History:  Diagnosis Date  . COPD (chronic obstructive pulmonary disease) (HCC)     There are no active problems to display for this patient.   Past Surgical History:  Procedure Laterality Date  . arm surgery         Home Medications    Prior to Admission medications   Medication Sig Start Date End Date Taking? Authorizing Provider  albuterol (PROVENTIL HFA;VENTOLIN HFA) 108 (90 BASE) MCG/ACT inhaler Inhale 1-2 puffs into the lungs every 6 (six) hours as needed for wheezing or shortness of breath.    Historical Provider, MD  ondansetron (ZOFRAN ODT) 4 MG disintegrating tablet Take 1 tablet (4 mg total) by mouth every 8 (eight) hours as needed for nausea or vomiting. 05/28/16   Kristen N Ward, DO  oxyCODONE (ROXICODONE) 5 MG immediate release tablet Take 1 tablet (5 mg total) by mouth every 6 (six) hours as needed for severe pain. 05/28/16   Kristen N Ward, DO  thiamine (VITAMIN B-1) 100  MG tablet Take 1 tablet (100 mg total) by mouth daily. 05/28/16   Layla MawKristen N Ward, DO    Family History History reviewed. No pertinent family history.  Social History Social History  Substance Use Topics  . Smoking status: Current Every Day Smoker    Packs/day: 1.00  . Smokeless tobacco: Never Used  . Alcohol use Yes     Comment: daily     Allergies   Patient has no known allergies.   Review of Systems Review of Systems  Constitutional: Negative for chills and fever.  HENT: Negative for congestion and facial swelling.   Eyes: Negative for discharge and visual disturbance.  Respiratory: Negative for shortness of breath.   Cardiovascular: Negative for chest pain and palpitations.  Gastrointestinal: Negative for abdominal pain, diarrhea and vomiting.  Musculoskeletal: Positive for arthralgias and myalgias.  Skin: Negative for color change and rash.  Neurological: Negative for tremors, syncope and headaches.  Psychiatric/Behavioral: Negative for confusion and dysphoric mood.     Physical Exam Updated Vital Signs BP 142/97 (BP Location: Left Arm)   Pulse 80   Temp 98.2 F (36.8 C) (Oral)   Resp 18   Ht 5\' 6"  (1.676 m)   Wt 118 lb (53.5 kg)   SpO2 100%   BMI 19.05 kg/m   Physical Exam  Constitutional: He is oriented to person, place, and time. He appears well-developed and  well-nourished.  HENT:  Head: Normocephalic and atraumatic.  Eyes: EOM are normal. Pupils are equal, round, and reactive to light.  Neck: Normal range of motion. Neck supple. No JVD present.  Cardiovascular: Normal rate and regular rhythm.  Exam reveals no gallop and no friction rub.   No murmur heard. Pulmonary/Chest: No respiratory distress. He has no wheezes.  Abdominal: He exhibits no distension and no mass. There is no tenderness. There is no rebound and no guarding.  Musculoskeletal: Normal range of motion.  Patient has a erythematous line that separates the bottom from the top part of his  foot. Intact pulses. Patient has some yeasty smell as well as cracks in between his toes.  Neurological: He is alert and oriented to person, place, and time.  Skin: No rash noted. No pallor.  Psychiatric: He has a normal mood and affect. His behavior is normal.  Nursing note and vitals reviewed.    ED Treatments / Results  Labs (all labs ordered are listed, but only abnormal results are displayed) Labs Reviewed - No data to display  EKG  EKG Interpretation None       Radiology No results found.  Procedures Procedures (including critical care time)  Medications Ordered in ED Medications - No data to display   Initial Impression / Assessment and Plan / ED Course  I have reviewed the triage vital signs and the nursing notes.  Pertinent labs & imaging results that were available during my care of the patient were reviewed by me and considered in my medical decision making (see chart for details).     51 yo M With a chief complaint of bilateral plantar surface of the foot pain. This is likely athlete's foot by the way it looks. However has been fairly cold over the past couple weeks and there is possibility this could be frostbite. There is no blistering. Patient has intact sensation. We'll have him follow-up with podiatry.  10:14 AM:  I have discussed the diagnosis/risks/treatment options with the patient and believe the pt to be eligible for discharge home to follow-up with Podiatry. We also discussed returning to the ED immediately if new or worsening sx occur. We discussed the sx which are most concerning (e.g., sudden worsening pain, fever, inability to tolerate by mouth) that necessitate immediate return. Medications administered to the patient during their visit and any new prescriptions provided to the patient are listed below.  Medications given during this visit Medications - No data to display   The patient appears reasonably screen and/or stabilized for discharge  and I doubt any other medical condition or other [facility] requiring further screening, evaluation, or treatment in the ED at this time prior to discharge.    Final Clinical Impressions(s) / ED Diagnoses   Final diagnoses:  Tinea pedis of both feet    New Prescriptions New Prescriptions   No medications on file     Melene Plan, DO 08/12/16 1014

## 2016-08-12 NOTE — ED Triage Notes (Addendum)
Patient reports bilateral foot pain x 2 weeks.  Denies injury.  Reports increased pain with ambulation. States pain occurs bilaterally over the entire foot.  Discoloration noted to bilateral feet.

## 2017-04-15 NOTE — Progress Notes (Deleted)
Cardiology Office Note   Date:  04/15/2017   ID:  Nicholas Smith, DOB Jul 31, 1965, MRN [id number]  PCP:  Chauncey Reading, PA-C  Cardiologist:   Rollene Rotunda, MD  Referring:  ***  No chief complaint on file.     History of Present Illness: Nicholas Smith is a 51 y.o. male who is referred by Chauncey Reading, PA-C for evaluation of chest pain.  He was seen in August and mentioned this.  He also had active severe depression at that time.  I reviewed these records for this visit.  I do not see an EKG done at that time.  I don't see any prior cardiac history.    ***    Past Medical History:  Diagnosis Date  . COPD (chronic obstructive pulmonary disease) (HCC)     Past Surgical History:  Procedure Laterality Date  . arm surgery       Current Outpatient Prescriptions  Medication Sig Dispense Refill  . albuterol (PROVENTIL HFA;VENTOLIN HFA) 108 (90 BASE) MCG/ACT inhaler Inhale 1-2 puffs into the lungs every 6 (six) hours as needed for wheezing or shortness of breath.    . ondansetron (ZOFRAN ODT) 4 MG disintegrating tablet Take 1 tablet (4 mg total) by mouth every 8 (eight) hours as needed for nausea or vomiting. 20 tablet 0  . oxyCODONE (ROXICODONE) 5 MG immediate release tablet Take 1 tablet (5 mg total) by mouth every 6 (six) hours as needed for severe pain. 10 tablet 0  . thiamine (VITAMIN B-1) 100 MG tablet Take 1 tablet (100 mg total) by mouth daily. 90 tablet 1   No current facility-administered medications for this visit.     Allergies:   Patient has no known allergies.    Social History:  The patient  reports that he has been smoking.  He has been smoking about 1.00 pack per day. He has never used smokeless tobacco. He reports that he drinks alcohol. He reports that he does not use drugs.   Family History:  The patient's ***family history is not on file.    ROS:  Please see the history of present illness.   Otherwise, review of systems are positive for {NONE  DEFAULTED:18576::"none"}.   All other systems are reviewed and negative.    PHYSICAL EXAM: VS:  There were no vitals taken for this visit. , BMI There is no height or weight on file to calculate BMI. GENERAL:  Well appearing HEENT:  Pupils equal round and reactive, fundi not visualized, oral mucosa unremarkable NECK:  No jugular venous distention, waveform within normal limits, carotid upstroke brisk and symmetric, no bruits, no thyromegaly LYMPHATICS:  No cervical, inguinal adenopathy LUNGS:  Clear to auscultation bilaterally BACK:  No CVA tenderness CHEST:  Unremarkable HEART:  PMI not displaced or sustained,S1 and S2 within normal limits, no S3, no S4, no clicks, no rubs, *** murmurs ABD:  Flat, positive bowel sounds normal in frequency in pitch, no bruits, no rebound, no guarding, no midline pulsatile mass, no hepatomegaly, no splenomegaly EXT:  2 plus pulses throughout, no edema, no cyanosis no clubbing SKIN:  No rashes no nodules NEURO:  Cranial nerves II through XII grossly intact, motor grossly intact throughout PSYCH:  Cognitively intact, oriented to person place and time    EKG:  EKG {ACTION; IS/IS JXB:[id number]} ordered today. The ekg ordered today demonstrates ***   Recent Labs: 05/28/2016: ALT 53; BUN 5; Creatinine, Ser 0.74; Hemoglobin 15.5; Platelets 266; Potassium 3.6; Sodium 139  Lipid Panel No results found for: CHOL, TRIG, HDL, CHOLHDL, VLDL, LDLCALC, LDLDIRECT    Wt Readings from Last 3 Encounters:  08/12/16 118 lb (53.5 kg)  05/27/16 120 lb (54.4 kg)  11/08/15 120 lb (54.4 kg)      Other studies Reviewed: Additional studies/ records that were reviewed today include: ***. Review of the above records demonstrates:  Please see elsewhere in the note.  ***   ASSESSMENT AND PLAN:  ***   Current medicines are reviewed at length with the patient today.  The patient {ACTIONS; HAS/DOES NOT HAVE:19233} concerns regarding medicines.  The following changes  have been made:  {PLAN; NO CHANGE:13088:s}  Labs/ tests ordered today include: *** No orders of the defined types were placed in this encounter.    Disposition:   FU with ***    Signed, Rollene Rotunda, MD  04/15/2017 1:22 PM    [facility]

## 2017-04-16 ENCOUNTER — Ambulatory Visit: Payer: Self-pay | Admitting: Cardiology

## 2017-04-17 ENCOUNTER — Encounter: Payer: Self-pay | Admitting: *Deleted

## 2018-11-08 IMAGING — CT CT ABD-PELV W/ CM
2 of 5 series · 16 of 46 positions shown, 18 images · IV contrast (iopamidol)
Comparison: Radiograph 01/05/2009

CLINICAL DATA: Diffuse abdominal pain for 2 weeks right upper
quadrant pain

EXAM:
CT ABDOMEN AND PELVIS WITH CONTRAST
TECHNIQUE: Multidetector CT imaging of the abdomen and pelvis was performed
using the standard protocol following bolus administration of
intravenous contrast.
CONTRAST:  100mL OIPFKZ-PAA IOPAMIDOL (OIPFKZ-PAA) INJECTION 61%

[Series 2: axial st · axial · 0.64mm/px · z∈[-503,-78]mm · 13 of 95 slices shown, 15 images]
[im 5/95  soft-tissue]
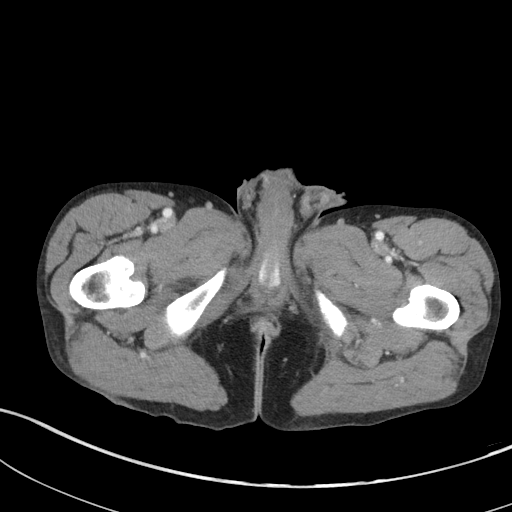
[im 5/95  bone]
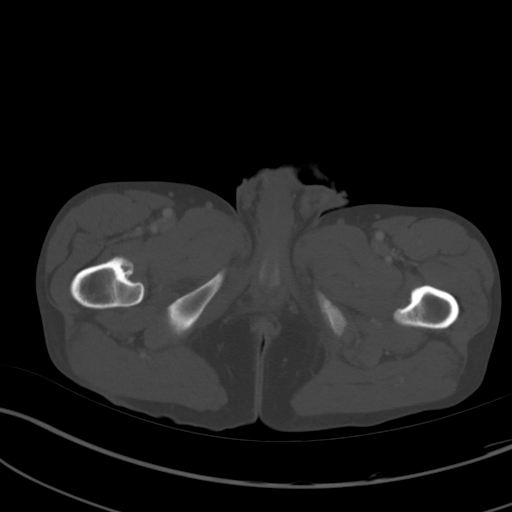
[im 15/95  soft-tissue]
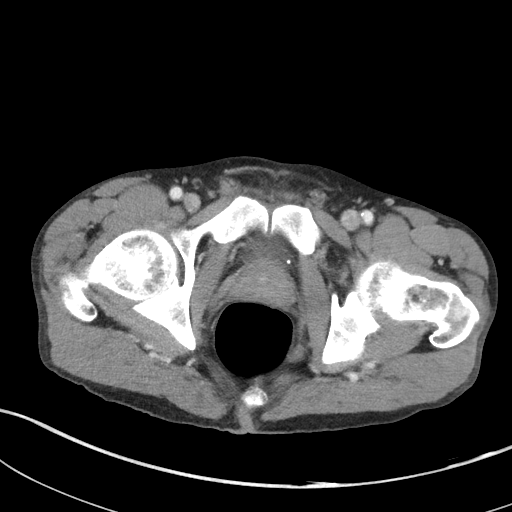
[im 19/95  soft-tissue]
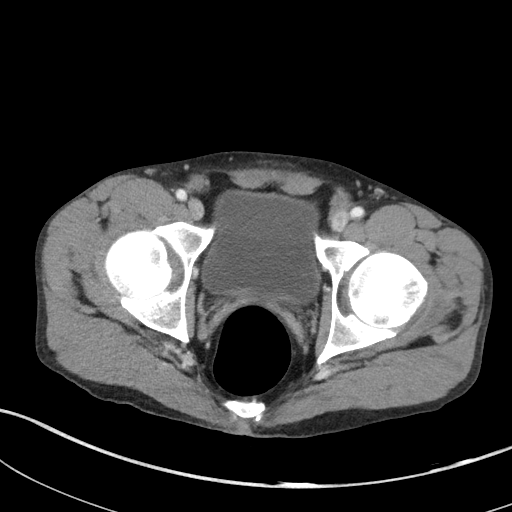
[im 29/95  soft-tissue]
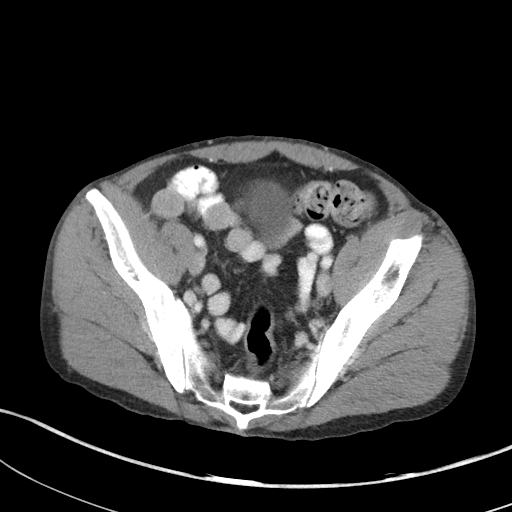
[im 33/95  soft-tissue]
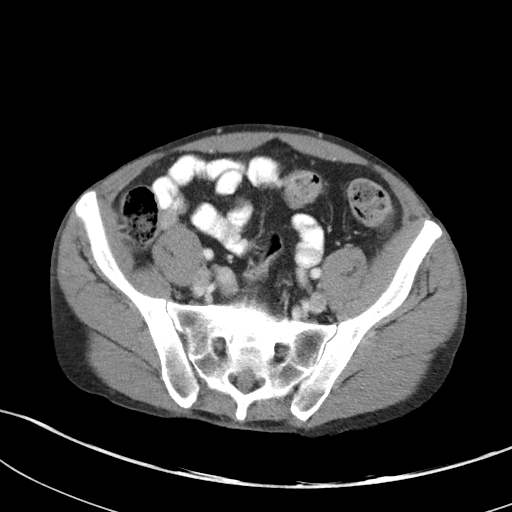
[im 43/95  soft-tissue]
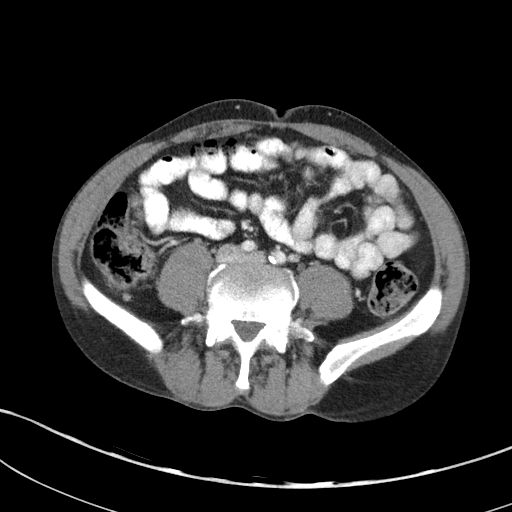
[im 48/95  soft-tissue]
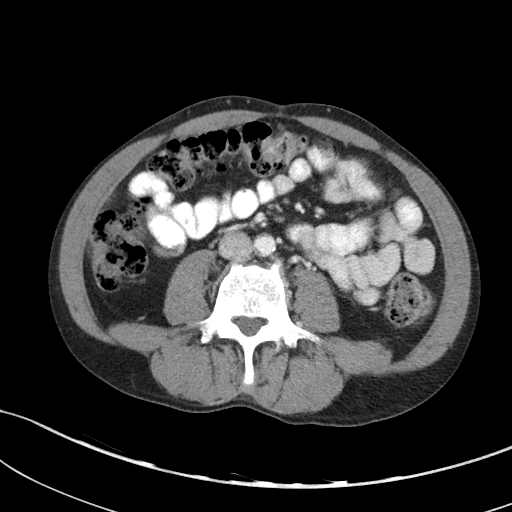
[im 52/95  soft-tissue]
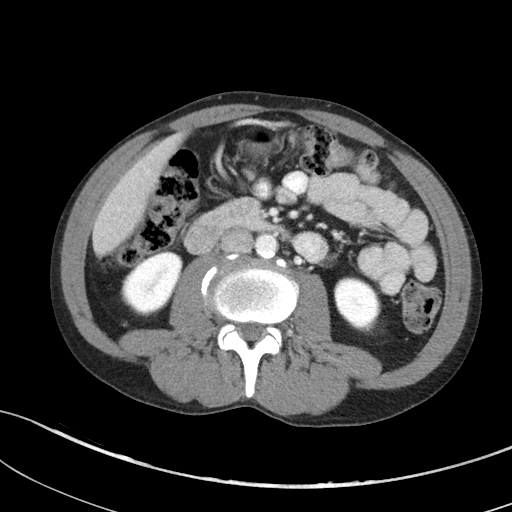
[im 62/95  soft-tissue]
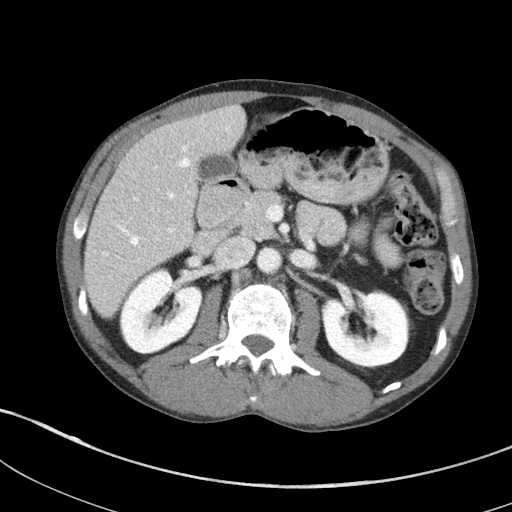
[im 62/95  bone]
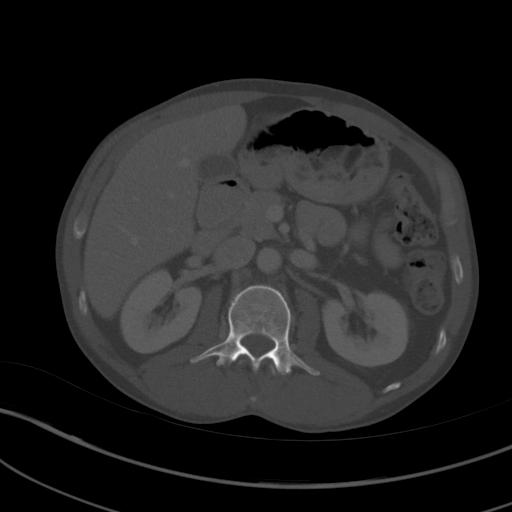
[im 66/95  soft-tissue]
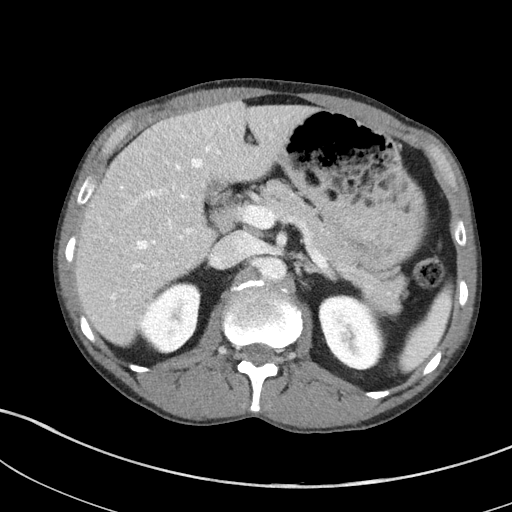
[im 76/95  soft-tissue]
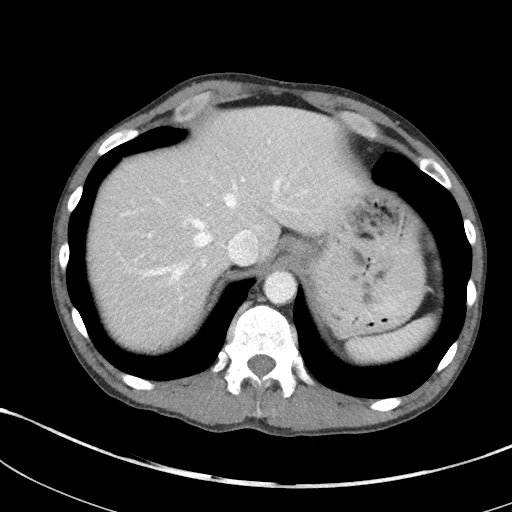
[im 80/95  soft-tissue]
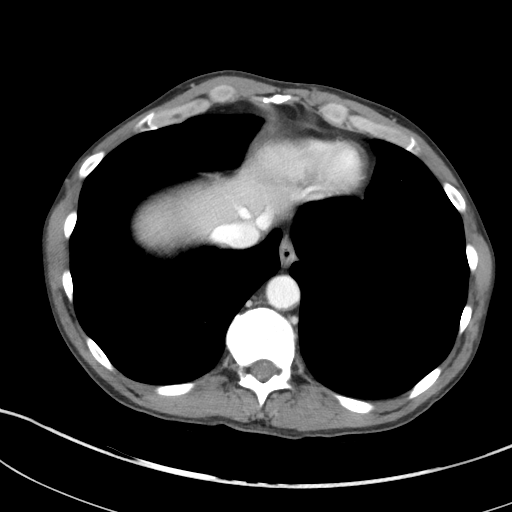
[im 90/95  soft-tissue]
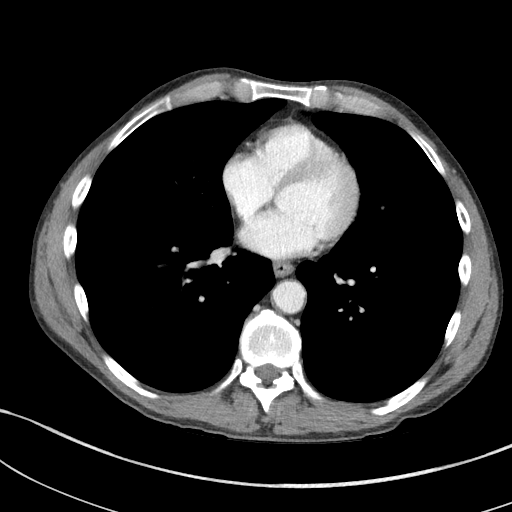

[Series 5: coronal st · coronal · 0.61mm/px · 3 of 74 slices shown]
[im 25/74  soft-tissue]
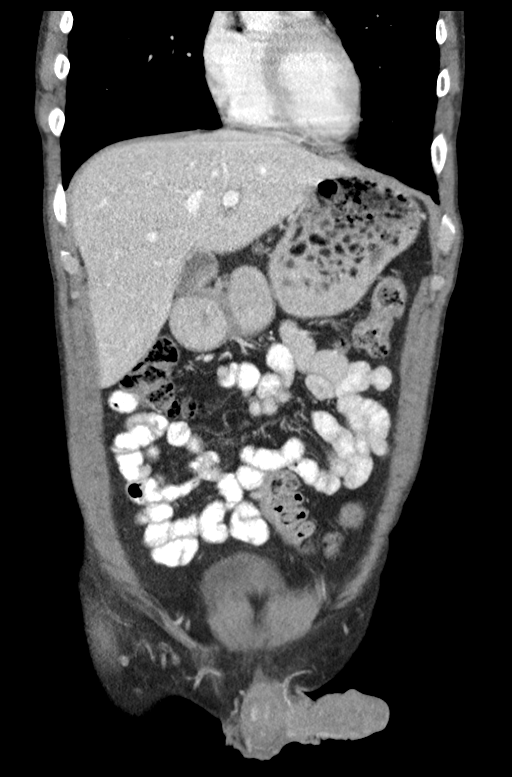
[im 33/74  soft-tissue]
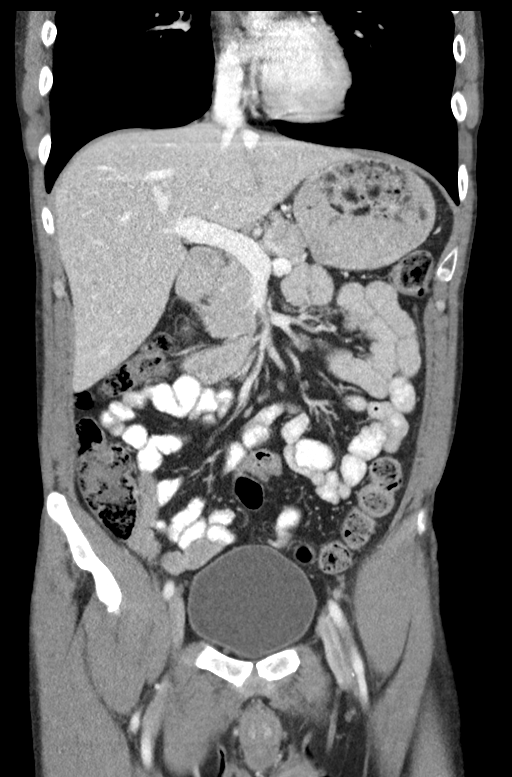
[im 41/74  soft-tissue]
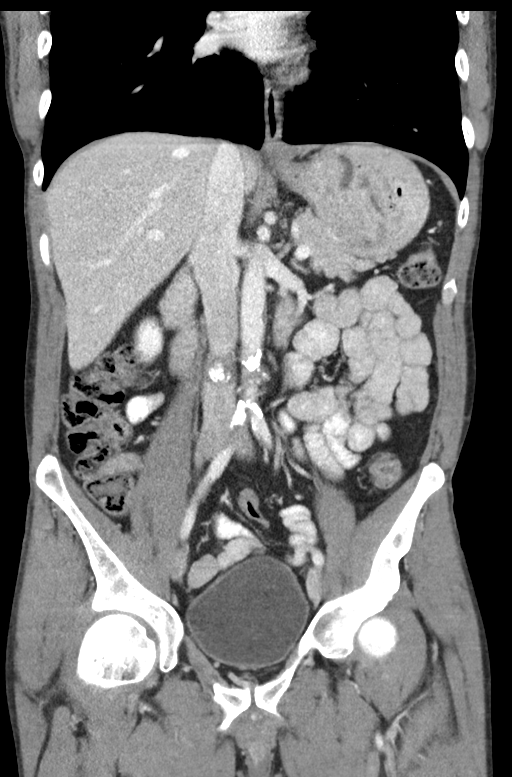

[16 of 46 positions shown; findings below may reference images not displayed]

FINDINGS: Lower chest: Lung bases demonstrate no acute consolidation or
pleural effusion. Normal heart size. Distal esophagus is
unremarkable.

Hepatobiliary: No focal liver abnormality is seen. No gallstones,
gallbladder wall thickening, or biliary dilatation.

Pancreas: Unremarkable. No pancreatic ductal dilatation or
surrounding inflammatory changes.

Spleen: Normal in size without focal abnormality.

Adrenals/Urinary Tract: Adrenal glands are unremarkable. Kidneys are
normal, without renal calculi, focal lesion, or hydronephrosis.
Bladder is unremarkable.

Stomach/Bowel: Stomach is within normal limits. Appendix appears
normal. No evidence of bowel wall thickening, distention, or
inflammatory changes.

Vascular/Lymphatic: Aortic atherosclerosis. No enlarged abdominal or
pelvic lymph nodes.

Reproductive: Prostate is unremarkable.

Other: No free air or free fluid.

Musculoskeletal: No acute osseous abnormality. Degenerative changes
of the lumbar spine.
IMPRESSION: No CT evidence for acute intra-abdominal or pelvic pathology.

## 2019-11-16 ENCOUNTER — Encounter: Payer: Self-pay | Admitting: Internal Medicine

## 2019-12-16 ENCOUNTER — Encounter: Payer: Self-pay | Admitting: Gastroenterology

## 2019-12-16 NOTE — Progress Notes (Deleted)
Referring Provider: Rebekah Chesterfield, NP Primary Care Physician:  Rebekah Chesterfield, NP Primary Gastroenterologist:  Dr. Jena Gauss  No chief complaint on file.   HPI:   Nicholas Smith is a 54 y.o. male presenting today at the request of Rebekah Chesterfield, NP for GERD.     Past Medical History:  Diagnosis Date  . COPD (chronic obstructive pulmonary disease) (HCC)     Past Surgical History:  Procedure Laterality Date  . arm surgery      Current Outpatient Medications  Medication Sig Dispense Refill  . albuterol (PROVENTIL HFA;VENTOLIN HFA) 108 (90 BASE) MCG/ACT inhaler Inhale 1-2 puffs into the lungs every 6 (six) hours as needed for wheezing or shortness of breath.    . ondansetron (ZOFRAN ODT) 4 MG disintegrating tablet Take 1 tablet (4 mg total) by mouth every 8 (eight) hours as needed for nausea or vomiting. 20 tablet 0  . oxyCODONE (ROXICODONE) 5 MG immediate release tablet Take 1 tablet (5 mg total) by mouth every 6 (six) hours as needed for severe pain. 10 tablet 0  . thiamine (VITAMIN B-1) 100 MG tablet Take 1 tablet (100 mg total) by mouth daily. 90 tablet 1   No current facility-administered medications for this visit.    Allergies as of 12/17/2019  . (No Known Allergies)    No family history on file.  Social History   Socioeconomic History  . Marital status: Single    Spouse name: Not on file  . Number of children: Not on file  . Years of education: Not on file  . Highest education level: Not on file  Occupational History  . Not on file  Tobacco Use  . Smoking status: Current Every Day Smoker    Packs/day: 1.00  . Smokeless tobacco: Never Used  Substance and Sexual Activity  . Alcohol use: Yes    Comment: daily  . Drug use: No  . Sexual activity: Not on file  Other Topics Concern  . Not on file  Social History Narrative  . Not on file   Social Determinants of Health   Financial Resource Strain:   . Difficulty of Paying Living Expenses:     Food Insecurity:   . Worried About Programme researcher, broadcasting/film/video in the Last Year:   . Barista in the Last Year:   Transportation Needs:   . Freight forwarder (Medical):   Marland Kitchen Lack of Transportation (Non-Medical):   Physical Activity:   . Days of Exercise per Week:   . Minutes of Exercise per Session:   Stress:   . Feeling of Stress :   Social Connections:   . Frequency of Communication with Friends and Family:   . Frequency of Social Gatherings with Friends and Family:   . Attends Religious Services:   . Active Member of Clubs or Organizations:   . Attends Banker Meetings:   Marland Kitchen Marital Status:   Intimate Partner Violence:   . Fear of Current or Ex-Partner:   . Emotionally Abused:   Marland Kitchen Physically Abused:   . Sexually Abused:     Review of Systems: Gen: Denies any fever, chills, fatigue, weight loss, lack of appetite.  CV: Denies chest pain, heart palpitations, peripheral edema, syncope.  Resp: Denies shortness of breath at rest or with exertion. Denies wheezing or cough.  GI: Denies dysphagia or odynophagia. Denies jaundice, hematemesis, fecal incontinence. GU : Denies urinary burning, urinary frequency, urinary hesitancy MS: Denies joint pain,  muscle weakness, cramps, or limitation of movement.  Derm: Denies rash, itching, dry skin Psych: Denies depression, anxiety, memory loss, and confusion Heme: Denies bruising, bleeding, and enlarged lymph nodes.  Physical Exam: There were no vitals taken for this visit. General:   Alert and oriented. Pleasant and cooperative. Well-nourished and well-developed.  Head:  Normocephalic and atraumatic. Eyes:  Without icterus, sclera clear and conjunctiva pink.  Ears:  Normal auditory acuity. Nose:  No deformity, discharge,  or lesions. Mouth:  No deformity or lesions, oral mucosa pink.  Neck:  Supple, without mass or thyromegaly. Lungs:  Clear to auscultation bilaterally. No wheezes, rales, or rhonchi. No distress.   Heart:  S1, S2 present without murmurs appreciated.  Abdomen:  +BS, soft, non-tender and non-distended. No HSM noted. No guarding or rebound. No masses appreciated.  Rectal:  Deferred  Msk:  Symmetrical without gross deformities. Normal posture. Pulses:  Normal pulses noted. Extremities:  Without clubbing or edema. Neurologic:  Alert and  oriented x4;  grossly normal neurologically. Skin:  Intact without significant lesions or rashes. Cervical Nodes:  No significant cervical adenopathy. Psych:  Alert and cooperative. Normal mood and affect.

## 2019-12-17 ENCOUNTER — Ambulatory Visit: Payer: Medicare Other | Admitting: Gastroenterology

## 2019-12-17 ENCOUNTER — Encounter: Payer: Self-pay | Admitting: Gastroenterology
